# Patient Record
Sex: Female | Born: 1990 | Race: White | Hispanic: No | Marital: Single | Smoking: Never smoker
Health system: Southern US, Community
[De-identification: ages and names within clinical notes are randomized; demographics above are authoritative.]

## PROBLEM LIST (undated history)

## (undated) DIAGNOSIS — R42 Dizziness and giddiness: Secondary | ICD-10-CM

## (undated) DIAGNOSIS — D509 Iron deficiency anemia, unspecified: Secondary | ICD-10-CM

## (undated) DIAGNOSIS — E162 Hypoglycemia, unspecified: Secondary | ICD-10-CM

## (undated) DIAGNOSIS — J069 Acute upper respiratory infection, unspecified: Secondary | ICD-10-CM

## (undated) HISTORY — DX: Hypoglycemia, unspecified: E16.2

## (undated) HISTORY — DX: Dizziness and giddiness: R42

## (undated) HISTORY — DX: Iron deficiency anemia, unspecified: D50.9

## (undated) HISTORY — DX: Acute upper respiratory infection, unspecified: J06.9

---
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
## 2019-12-17 ENCOUNTER — Emergency Department
Admission: EM | Admit: 2019-12-17 | Discharge: 2019-12-17 | Disposition: A | Discharge status: Home / Self Care | Payer: Self-pay | Source: Home / Self Care | Attending: Family Medicine | Admitting: Family Medicine

## 2019-12-17 ENCOUNTER — Other Ambulatory Visit: Payer: Self-pay

## 2019-12-17 DIAGNOSIS — R300 Dysuria: Secondary | ICD-10-CM

## 2019-12-17 DIAGNOSIS — N3090 Cystitis, unspecified without hematuria: Secondary | ICD-10-CM | POA: Diagnosis not present

## 2019-12-17 LAB — POCT URINALYSIS DIP (MANUAL ENTRY)
Bilirubin, UA: NEGATIVE
Glucose, UA: NEGATIVE mg/dL
Ketones, POC UA: NEGATIVE mg/dL
Leukocytes, UA: NEGATIVE
Nitrite, UA: NEGATIVE
Protein Ur, POC: NEGATIVE mg/dL
Spec Grav, UA: >=1.03 — AB (ref 1.010–1.025)
Urobilinogen, UA: 0.2 E.U./dL
pH, UA: 5.5 (ref 5.0–8.0)

## 2019-12-17 MED ORDER — NITROFURANTOIN MONOHYD MACRO 100 MG PO CAPS: 100.0000 mg | ORAL_CAPSULE | Freq: Two times a day (BID) | ORAL | 0 refills | Status: AC

## 2019-12-17 NOTE — Discharge Instructions (Addendum)
Increase fluid intake. May use non-prescription AZO for about two days, if desired, to decrease urinary discomfort.  If symptoms become significantly worse during the night or over the weekend, proceed to the local emergency room.  

## 2019-12-17 NOTE — ED Provider Notes (Signed)
[facility]    CSN: [id number] Arrival date & time: 12/17/19  1837      History   Chief Complaint Chief Complaint  Patient presents with  . Dysuria  . Back Pain    lower    HPI [name] Brennan is a 29 y.o. female.   Patient complains of dysuria and vague low back pain for two weeks.  She denies fevers, chills, and sweats and feels well otherwise.  Patient's last menstrual period was 12/10/2019.  She has a history of recurrent UTI's.  The history is provided by the patient.  Dysuria Pain quality:  Burning Pain severity:  Mild Onset quality:  Gradual Duration:  2 weeks Timing:  Constant Progression:  Worsening Chronicity:  New Recent urinary tract infections: no   Relieved by:  Nothing Worsened by:  Nothing Ineffective treatments:  Cranberry juice Urinary symptoms: frequent urination and hesitancy   Urinary symptoms: no discolored urine, no foul-smelling urine, no hematuria and no bladder incontinence   Associated symptoms: no abdominal pain, no fever, no flank pain, no genital lesions, no nausea, no vaginal discharge and no vomiting   Risk factors: recurrent urinary tract infections     Past Medical History:  Diagnosis Date  . Anemia, iron deficiency   . Dizziness   . Hypoglycemia    NEC  . URI (upper respiratory infection)     Patient Active Problem List   Diagnosis Date Noted  . BACK PAIN 10/02/2009  . ARTHRALGIA 09/21/2009  . OTHER ABNORMAL CLINICAL FINDING 09/21/2009  . PAIN IN JOINT, MULTIPLE SITES 08/28/2009  . MYALGIA 08/27/2009  . FATIGUE 08/27/2009  . LEG CRAMPS 10/30/2008  . ANEMIA, IRON DEFICIENCY 03/10/2008  . URI 12/26/2007  . HYPOGLYCEMIA NEC 05/16/2007  . DIZZINESS 05/16/2007      OB History   No obstetric history on file.      Home Medications    Prior to Admission medications   Medication Sig Start Date End Date Taking? Authorizing Provider  docusate sodium (COLACE) 100 MG capsule Take 100 mg by mouth 2 (two)  times daily.      [provider]  escitalopram (LEXAPRO) 20 MG tablet Take 20 mg by mouth daily. 11/15/19   [provider]  ferrous sulfate 324 (65 FE) MG TBEC Take 1 tablet by mouth 2 (two) times daily.      [provider]  ibuprofen (ADVIL,MOTRIN) 200 MG tablet Take 400 mg by mouth every 6 (six) hours as needed.      [provider]  lamoTRIgine (LAMICTAL) 100 MG tablet Take 100 mg by mouth 2 (two) times daily. 11/05/19   [provider]  lithium carbonate (LITHOBID) 300 MG CR tablet Take 300 mg by mouth 2 (two) times daily. 11/30/19   [provider]  nitrofurantoin, macrocrystal-monohydrate, (MACROBID) 100 MG capsule Take 1 capsule (100 mg total) by mouth 2 (two) times daily for 7 days. 12/17/19 12/24/19  Lattie Haw, MD    Family History Family History  Problem Relation Age of Onset  . Thyroid disease Maternal Grandmother   . Diabetes Neg Hx     Social History Social History   Tobacco Use  . Smoking status: Never Smoker  . Smokeless tobacco: Never Used  Substance Use Topics  . Alcohol use: Yes    Comment: occ  . Drug use: Not on file     Allergies   Patient has no known allergies.   Review of Systems Review of Systems  Constitutional:  Negative for fever.  Gastrointestinal: Negative for abdominal pain, nausea and vomiting.  Genitourinary: Positive for dysuria, frequency and urgency. Negative for flank pain, hematuria, pelvic pain and vaginal discharge.  All other systems reviewed and are negative.    Physical Exam Triage Vital Signs ED Triage Vitals  Enc Vitals Group     BP 12/17/19 1850 118/78     Pulse Rate 12/17/19 1850 64     Resp 12/17/19 1850 18     Temp 12/17/19 1848 98 F (36.7 C)     Temp Source 12/17/19 1848 Oral     SpO2 12/17/19 1850 100 %     Weight 12/17/19 1848 126 lb (57.2 kg)     Height 12/17/19 1848 5\' 4"  (1.626 m)     Head Circumference --      Peak Flow --      Pain Score 12/17/19  1850 0     Pain Loc --      Pain Edu? --      Excl. in [place]? --    No data found.  Updated Vital Signs BP 118/78 (BP Location: Left Arm)   Pulse 64   Temp 98 F (36.7 C) (Oral)   Resp 18   Ht 5\' 4"  (1.626 m)   Wt 57.2 kg   LMP 12/10/2019   SpO2 100%   BMI 21.63 kg/m   Visual Acuity Right Eye Distance:   Left Eye Distance:   Bilateral Distance:    Right Eye Near:   Left Eye Near:    Bilateral Near:     Physical Exam Nursing notes and Vital Signs reviewed. Appearance:  Patient appears stated age, and in no acute distress.    Eyes:  Pupils are equal, round, and reactive to light and accomodation.  Extraocular movement is intact.  Conjunctivae are not inflamed   Pharynx:  Normal; moist mucous membranes  Neck:  Supple.  No adenopathy Lungs:  Clear to auscultation.  Breath sounds are equal.  Moving air well. Heart:  Regular rate and rhythm without murmurs, rubs, or gallops.  Abdomen:  Nontender without masses or hepatosplenomegaly.  Bowel sounds are present.  No CVA or flank tenderness.  Extremities:  No edema.  Skin:  No rash present.     UC Treatments / Results  Labs (all labs ordered are listed, but only abnormal results are displayed) Labs Reviewed  POCT URINALYSIS DIP (MANUAL ENTRY) - Abnormal; Notable for the following components:      Result Value   Clarity, UA cloudy (*)    Spec Grav, UA >=1.030 (*)    Blood, UA trace-intact (*)    All other components within normal limits  URINE CULTURE    EKG   Radiology No results found.  Procedures Procedures (including critical care time)  Medications Ordered in UC Medications - No data to display  Initial Impression / Assessment and Plan / UC Course  I have reviewed the triage vital signs and the nursing notes.  Pertinent labs & imaging results that were available during my care of the patient were reviewed by me and considered in my medical decision making (see chart for details).    Urine culture  pending. Begin Macrobid 100mg  BID for one week.   Followup with Family Doctor if not improved in one week.    Final Clinical Impressions(s) / UC Diagnoses   Final diagnoses:  Cystitis  Dysuria     Discharge Instructions     Increase fluid intake. May use  non-prescription AZO for about two days, if desired, to decrease urinary discomfort.   If symptoms become significantly worse during the night or over the weekend, proceed to the local emergency room.     ED Prescriptions    Medication Sig Dispense Auth. Provider   nitrofurantoin, macrocrystal-monohydrate, (MACROBID) 100 MG capsule Take 1 capsule (100 mg total) by mouth 2 (two) times daily for 7 days. 14 capsule Lattie Haw, MD        Lattie Haw, MD 12/18/19 [phone]

## 2019-12-17 NOTE — ED Triage Notes (Signed)
Pt c/o dysuria and low back pain x 2 weeks. No OTC meds taken. States she is prone to UTIs

## 2019-12-19 LAB — URINE CULTURE
MICRO NUMBER:: 10284128
Result:: NO GROWTH
SPECIMEN QUALITY:: ADEQUATE
